# Patient Record
Sex: Female | Born: 1975 | Race: White | Hispanic: No | Marital: Married | Smoking: Never smoker
Health system: Southern US, Community
[De-identification: ages and names within clinical notes are randomized; demographics above are authoritative.]

## PROBLEM LIST (undated history)

## (undated) DIAGNOSIS — K219 Gastro-esophageal reflux disease without esophagitis: Secondary | ICD-10-CM

## (undated) DIAGNOSIS — I10 Essential (primary) hypertension: Secondary | ICD-10-CM

## (undated) HISTORY — DX: Gastro-esophageal reflux disease without esophagitis: K21.9

## (undated) HISTORY — PX: ABDOMINAL HYSTERECTOMY: SHX81

## (undated) HISTORY — PX: WISDOM TOOTH EXTRACTION: SHX21

## (undated) HISTORY — PX: MANDIBLE SURGERY: SHX707

## (undated) HISTORY — DX: Essential (primary) hypertension: I10

## (undated) HISTORY — PX: BREAST ENHANCEMENT SURGERY: SHX7

## (undated) HISTORY — PX: AUGMENTATION MAMMAPLASTY: SUR837

---

## 2015-06-24 ENCOUNTER — Ambulatory Visit
Admission: RE | Admit: 2015-06-24 | Discharge: 2015-06-24 | Disposition: A | Discharge status: Home / Self Care | Payer: BLUE CROSS/BLUE SHIELD | Source: Ambulatory Visit | Attending: Family Medicine | Admitting: Family Medicine

## 2015-06-24 ENCOUNTER — Other Ambulatory Visit: Payer: Self-pay | Admitting: Family Medicine

## 2015-06-24 DIAGNOSIS — M545 Low back pain: Secondary | ICD-10-CM

## 2016-10-18 ENCOUNTER — Ambulatory Visit (INDEPENDENT_AMBULATORY_CARE_PROVIDER_SITE_OTHER): Payer: Self-pay | Admitting: Nurse Practitioner

## 2016-10-18 VITALS — BP 130/90 | HR 88 | Temp 98.2°F | Wt 145.0 lb

## 2016-10-18 DIAGNOSIS — W540XXA Bitten by dog, initial encounter: Secondary | ICD-10-CM

## 2016-10-18 DIAGNOSIS — L03119 Cellulitis of unspecified part of limb: Secondary | ICD-10-CM

## 2016-10-18 MED ORDER — CLINDAMYCIN HCL 300 MG PO CAPS: 300.0000 mg | ORAL_CAPSULE | Freq: Two times a day (BID) | ORAL | 0 refills | Status: AC

## 2016-10-18 MED ORDER — MUPIROCIN CALCIUM 2 % EX CREA: 1.0000 "application " | TOPICAL_CREAM | Freq: Two times a day (BID) | CUTANEOUS | 0 refills | Status: AC

## 2016-10-18 NOTE — Patient Instructions (Addendum)
Cellulitis, Adult Introduction Cellulitis is a skin infection. The infected area is usually red and sore. This condition occurs most often in the arms and lower legs. It is very important to get treated for this condition. Follow these instructions at home:  Take over-the-counter and prescription medicines only as told by your doctor.  Use Ibuprofen 800mg  every 8 hours for pain and inflammation.  If you were prescribed an antibiotic medicine, take it as told by your doctor. Do not stop taking the antibiotic even if you start to feel better.  Drink enough fluid to keep your pee (urine) clear or pale yellow.  Do not touch or rub the infected area.  Raise (elevate) the infected area above the level of your heart while you are sitting or lying down.  Place warm or cold wet cloths (warm or cold compresses) on the infected area. Do this as told by your doctor.  Keep all follow-up visits as told by your doctor. This is important. These visits let your doctor make sure your infection is not getting worse. Contact a doctor if:  You have a fever.  Your symptoms do not get better after 1-2 days of treatment.  Your bone or joint under the infected area starts to hurt after the skin has healed.  Your infection comes back. This can happen in the same area or another area.  You have a swollen bump in the infected area.  You have new symptoms.  You feel ill and also have muscle aches and pains. Get help right away if:  Your symptoms get worse.  You feel very sleepy.  You throw up (vomit) or have watery poop (diarrhea) for a long time.  There are red streaks coming from the infected area.  Your red area gets larger.  Your red area turns darker. This information is not intended to replace advice given to you by your health care provider. Make sure you discuss any questions you have with your health care provider. Document Released: 04/02/2008 Document Revised: 03/22/2016 Document  Reviewed: 08/24/2015  2017 Elsevier  Animal Bite Animal bites can range from mild to serious. An animal bite can result in a scratch on the skin, a deep open cut, a puncture of the skin, a crush injury, or tearing away of the skin or a body part. A small bite from a house pet will usually not cause serious problems. However, some animal bites can become infected or injure a bone or other tissue. Bites from certain animals can be more dangerous because of the risk of spreading rabies, which is a serious viral infection. This risk is higher with bites from stray animals or wild animals, such as raccoons, foxes, skunks, and bats. Dogs are responsible for most animal bites. Children are bitten more often than adults. What are the signs or symptoms? Common symptoms of an animal bite include:  Pain.  Bleeding.  Swelling.  Bruising. How is this diagnosed? This condition may be diagnosed based on a physical exam and medical history. Your health care provider will examine the wound and ask for details about the animal and how the bite happened. You may also have tests, such as:  Blood tests to check for infection or to determine if surgery is needed.  X-rays to check for damage to bones or joints.  Culture test. This uses a sample of fluid from the wound to check for infection. How is this treated? Treatment varies depending on the location and type of animal bite and  your medical history. Treatment may include:  Wound care. This often includes cleaning the wound, flushing the wound with saline solution, and applying a bandage (dressing). Sometimes, the wound is left open to heal because of the high risk of infection. However, in some cases, the wound may be closed with stitches (sutures), staples, skin glue, or adhesive strips.  Antibiotic medicine.  Tetanus shot.  Rabies treatment if the animal could have rabies. In some cases, bites that have become infected may require IV antibiotics  and surgical treatment in the hospital. Follow these instructions at home: Wound care  Follow instructions from your health care provider about how to take care of your wound. Make sure you:  Wash your hands with soap and water before you change your dressing. If soap and water are not available, use hand sanitizer.  Change your dressing as told by your health care provider.  Leave sutures, skin glue, or adhesive strips in place. These skin closures may need to be in place for 2 weeks or longer. If adhesive strip edges start to loosen and curl up, you may trim the loose edges. Do not remove adhesive strips completely unless your health care provider tells you to do that.  Check your wound every day for signs of infection. Watch for:  Increasing redness, swelling, or pain.  Fluid, blood, or pus. General instructions  Take or apply over-the-counter and prescription medicines only as told by your health care provider.  If you were prescribed an antibiotic, take or apply it as told by your health care provider. Do not stop using the antibiotic even if your condition improves.  Keep the injured area raised (elevated) above the level of your heart while you are sitting or lying down, if this is possible.  If directed, apply ice to the injured area.  Put ice in a plastic bag.  Place a towel between your skin and the bag.  Leave the ice on for 20 minutes, 2-3 times per day.  Keep all follow-up visits as told by your health care provider. This is important. Contact a health care provider if:  You have increasing redness, swelling, or pain at the site of your wound.  You have a general feeling of sickness (malaise).  You feel nauseous or you vomit.  You have pain that does not get better. Get help right away if:  You have a red streak extending away from your wound.  You have fluid, blood, or pus coming from your wound.  You have a fever or chills.  You have trouble moving  your injured area.  You have numbness or tingling extending beyond the wound. This information is not intended to replace advice given to you by your health care provider. Make sure you discuss any questions you have with your health care provider. Document Released: 07/03/2011 Document Revised: 02/22/2016 Document Reviewed: 03/02/2015 Elsevier Interactive Patient Education  2017 [employer].

## 2016-10-18 NOTE — Progress Notes (Addendum)
   Subjective:    Patient ID: Hannah Roberts, female    DOB: 06-09-1976, 40 y.o.   MRN: [id number]  The patient is a 40 y.o. Female that presents with a dog bite that occurred around 6pm the night prior.  The patient states she was walking her dog, while walking her dog there was a stray dog that lunged at her dog and she put her hand down to protect the dog.  The patient has no information regarding the dog or his vaccination records.  The patient's last tetanus shot was in 2009 and was administered the rabies immune globulin in 2006.  Today the patient has redness, swelling and pain in the RUE.  There is a puncture wound in the palm of the hand located at the base of the thumb.  There is also a small scratch on the outside of the thumb.  The patient denies fever, chills or sweats.  Patient c/o pain that rates 7/10, swelling, difficulty moving fingers and warmth to the RUE.   Patient also states she is having trouble lifting.  She works in a Psychiatric nurse work.      Review of Systems  Constitutional: Negative for activity change, fatigue and fever.  Respiratory: Negative.   Cardiovascular: Negative.   Skin: Wound: palm of right hand located at the base of the thumb.       Objective:   Physical Exam  Constitutional: She appears well-developed and well-nourished. She appears distressed (mild discomfort noted d/t RUE pain).  HENT:  Head: Normocephalic.  Eyes: Conjunctivae and EOM are normal. Pupils are equal, round, and reactive to light.  Neck: Normal range of motion.  Cardiovascular: Normal rate and regular rhythm.   Pulmonary/Chest: Effort normal and breath sounds normal.  Musculoskeletal: Normal range of motion.  Skin: Skin is warm and dry. There is erythema (erythematous, swollen RUE extending from hand to forearm.  puncture wound measuring approximately  0.5cm in diameter, no drainage, RUE warm to palpation., base of R thumb tender with palpation.).  Cap refill  >3 seconds, +2 radial pulse on RUE  Psychiatric: She has a normal mood and affect. Her behavior is normal. Judgment and thought content normal.    Patient's RUE (hand and forearm) cleansed with Chlorhexidine scrub.  Antibiotic ointment applied to puncture sites.  Dressing applied.  Patient tolerated well.     Assessment & Plan:  Dog Bite and Cellulitis.  Patient prescribed Clindamycin and instructed to use mupirocin ointment.  Patient will return in 24 hours for wound evaluation.  Patient verbalized understanding.

## 2016-10-19 ENCOUNTER — Encounter: Payer: Self-pay | Admitting: Nurse Practitioner

## 2016-10-19 MED ORDER — CLINDAMYCIN HCL 300 MG PO CAPS: 300.0000 mg | ORAL_CAPSULE | Freq: Four times a day (QID) | ORAL | 0 refills | Status: AC

## 2016-10-19 NOTE — Progress Notes (Signed)
Patient in for follow-up wound evaluation.  Patient with decreased swelling and warmth to RUE.  Patient states, "I am able to move my fingers a little easier".  Puncture site on palm at base of thumb with no signs of infection, no drainage, decreased redness and no warmth.  Patient continues to deny fever.  Patient instructed that additional abx will be added.  Patient instructed to call or return if concerns or no improvement.

## 2016-10-21 ENCOUNTER — Telehealth: Payer: Self-pay | Admitting: Nurse Practitioner

## 2016-10-21 NOTE — Telephone Encounter (Signed)
Called patient to follow up on her status.  The patient states she is doing better.  Patient states there is decreased swelling and redness.  Patient states her hand is still "a little sore", but she is doing much better.  Informed patient to continue her antibiotics and Ibuprofen.  Patient will call if concerns.

## 2016-12-11 ENCOUNTER — Ambulatory Visit (INDEPENDENT_AMBULATORY_CARE_PROVIDER_SITE_OTHER): Payer: Self-pay | Admitting: Nurse Practitioner

## 2016-12-11 ENCOUNTER — Encounter: Payer: Self-pay | Admitting: Nurse Practitioner

## 2016-12-11 VITALS — BP 124/86 | HR 86 | Temp 98.3°F | Wt 140.8 lb

## 2016-12-11 DIAGNOSIS — H00011 Hordeolum externum right upper eyelid: Secondary | ICD-10-CM

## 2016-12-11 MED ORDER — ERYTHROMYCIN 5 MG/GM OP OINT: 1.0000 "application " | TOPICAL_OINTMENT | Freq: Four times a day (QID) | OPHTHALMIC | 0 refills | Status: AC

## 2016-12-11 NOTE — Progress Notes (Signed)
Subjective:    Hannah Roberts is a 41 y.o. female who presents for evaluation of right eye soreness in the right eye. She has noticed the above symptoms for 2 weeks. Onset was sudden. Patient denies erythema, foreign body sensation, itching, pain and tearing.  The patient has been using warm compresses to the eye since the onset.  She has also plucked eyelashes from the same eye to r/o infection. There is a history of wearing glasses.  The following portions of the patient's history were reviewed and updated as appropriate: allergies, current medications and past medical history.  Review of Systems Constitutional: positive for fatigue, negative for fevers and night sweats Eyes: positive for irritation, redness and tearing, pain and itching Ears, nose, mouth, throat, and face: positive for earaches, sore throat and rhinorrhea Respiratory: negative Cardiovascular: negative Neurological: positive for headaches   Objective:    BP 124/86 (BP Location: Right Arm, Patient Position: Sitting, Cuff Size: Normal)   Pulse 86   Temp 98.3 F (36.8 C) (Oral)   Wt 140 lb 12.8 oz (63.9 kg)   SpO2 99%       General: alert and cooperative  Eyes:  conjunctivae/corneas clear. PERRL, EOM's intact. Fundi benign., swelling to right upper lid.  resolving ruptured blood vessels to pupil in the 6 o'clock position. erythema and swelling to right upper lid.  mobile hordeolum palpated.  Vision: Not performed  Fluorescein:  not done     Assessment:    Hordeolum   Plan:  Warm compresses 3-4 times/daily. Antibiotic ointment as ordered. Ibuprofen for pain as directed. No makeup until resolved. Cleanse affected eye with warm water. Go to ER if vision changes, increases pain or symptoms do not resolve.

## 2016-12-11 NOTE — Patient Instructions (Addendum)

## 2016-12-13 ENCOUNTER — Telehealth: Payer: Self-pay | Admitting: Nurse Practitioner

## 2016-12-13 NOTE — Telephone Encounter (Signed)
Called patient to follow up.  Spoke with patient and she states her eye is much better.  Patient states she still has some swelling and redness, but there is no pain or itching.  Informed patient to continue use of abx, warm compresses and Ibuprofen.  Patient will call of follow up if no improvement in the next 3-5 days.

## 2017-06-03 ENCOUNTER — Other Ambulatory Visit: Payer: Self-pay | Admitting: Family Medicine

## 2017-06-03 DIAGNOSIS — Z1231 Encounter for screening mammogram for malignant neoplasm of breast: Secondary | ICD-10-CM

## 2017-06-17 ENCOUNTER — Ambulatory Visit
Admission: RE | Admit: 2017-06-17 | Discharge: 2017-06-17 | Disposition: A | Discharge status: Home / Self Care | Payer: 59 | Source: Ambulatory Visit | Attending: Family Medicine | Admitting: Family Medicine

## 2017-06-17 DIAGNOSIS — Z1231 Encounter for screening mammogram for malignant neoplasm of breast: Secondary | ICD-10-CM

## 2018-07-25 ENCOUNTER — Other Ambulatory Visit: Payer: Self-pay | Admitting: Family Medicine

## 2018-07-25 DIAGNOSIS — Z1231 Encounter for screening mammogram for malignant neoplasm of breast: Secondary | ICD-10-CM

## 2018-08-22 ENCOUNTER — Ambulatory Visit
Admission: RE | Admit: 2018-08-22 | Discharge: 2018-08-22 | Disposition: A | Discharge status: Home / Self Care | Payer: 59 | Source: Ambulatory Visit | Attending: Family Medicine | Admitting: Family Medicine

## 2018-08-22 DIAGNOSIS — Z1231 Encounter for screening mammogram for malignant neoplasm of breast: Secondary | ICD-10-CM

## 2018-11-11 DIAGNOSIS — I10 Essential (primary) hypertension: Secondary | ICD-10-CM | POA: Diagnosis not present

## 2018-11-11 DIAGNOSIS — J111 Influenza due to unidentified influenza virus with other respiratory manifestations: Secondary | ICD-10-CM | POA: Diagnosis not present

## 2019-03-06 DIAGNOSIS — M9902 Segmental and somatic dysfunction of thoracic region: Secondary | ICD-10-CM | POA: Diagnosis not present

## 2019-03-06 DIAGNOSIS — M9903 Segmental and somatic dysfunction of lumbar region: Secondary | ICD-10-CM | POA: Diagnosis not present

## 2019-03-06 DIAGNOSIS — M9901 Segmental and somatic dysfunction of cervical region: Secondary | ICD-10-CM | POA: Diagnosis not present

## 2019-03-10 DIAGNOSIS — M9903 Segmental and somatic dysfunction of lumbar region: Secondary | ICD-10-CM | POA: Diagnosis not present

## 2019-03-10 DIAGNOSIS — M9902 Segmental and somatic dysfunction of thoracic region: Secondary | ICD-10-CM | POA: Diagnosis not present

## 2019-03-10 DIAGNOSIS — M9901 Segmental and somatic dysfunction of cervical region: Secondary | ICD-10-CM | POA: Diagnosis not present

## 2019-03-17 DIAGNOSIS — I10 Essential (primary) hypertension: Secondary | ICD-10-CM | POA: Diagnosis not present

## 2019-03-17 DIAGNOSIS — G479 Sleep disorder, unspecified: Secondary | ICD-10-CM | POA: Diagnosis not present

## 2019-03-20 DIAGNOSIS — M9901 Segmental and somatic dysfunction of cervical region: Secondary | ICD-10-CM | POA: Diagnosis not present

## 2019-03-20 DIAGNOSIS — M9903 Segmental and somatic dysfunction of lumbar region: Secondary | ICD-10-CM | POA: Diagnosis not present

## 2019-03-20 DIAGNOSIS — M9902 Segmental and somatic dysfunction of thoracic region: Secondary | ICD-10-CM | POA: Diagnosis not present

## 2019-07-31 ENCOUNTER — Other Ambulatory Visit: Payer: Self-pay | Admitting: Family Medicine

## 2019-07-31 DIAGNOSIS — Z1231 Encounter for screening mammogram for malignant neoplasm of breast: Secondary | ICD-10-CM

## 2019-08-28 ENCOUNTER — Ambulatory Visit
Admission: RE | Admit: 2019-08-28 | Discharge: 2019-08-28 | Disposition: A | Discharge status: Home / Self Care | Payer: 59 | Source: Ambulatory Visit | Attending: Family Medicine | Admitting: Family Medicine

## 2019-08-28 ENCOUNTER — Other Ambulatory Visit: Payer: Self-pay

## 2019-08-28 DIAGNOSIS — Z1231 Encounter for screening mammogram for malignant neoplasm of breast: Secondary | ICD-10-CM

## 2020-08-29 ENCOUNTER — Other Ambulatory Visit: Payer: Self-pay | Admitting: Family Medicine

## 2020-08-29 DIAGNOSIS — Z1231 Encounter for screening mammogram for malignant neoplasm of breast: Secondary | ICD-10-CM

## 2020-09-09 ENCOUNTER — Ambulatory Visit
Admission: RE | Admit: 2020-09-09 | Discharge: 2020-09-09 | Disposition: A | Discharge status: Home / Self Care | Payer: 59 | Source: Ambulatory Visit | Attending: Family Medicine | Admitting: Family Medicine

## 2020-09-09 ENCOUNTER — Other Ambulatory Visit: Payer: Self-pay

## 2020-09-09 DIAGNOSIS — Z1231 Encounter for screening mammogram for malignant neoplasm of breast: Secondary | ICD-10-CM

## 2021-12-15 IMAGING — MG DIGITAL SCREENING BREAST BILAT IMPLANT W/ TOMO W/ CAD
9 of 17 series · 9 of 40 positions shown · non-contrast
Comparison: Previous exam(s).

CLINICAL DATA: Screening.

EXAM:
DIGITAL SCREENING BILATERAL MAMMOGRAM WITH IMPLANTS, CAD AND TOMO
The patient has prepectoral implants. Standard and implant displaced
views were performed.

[R CC]
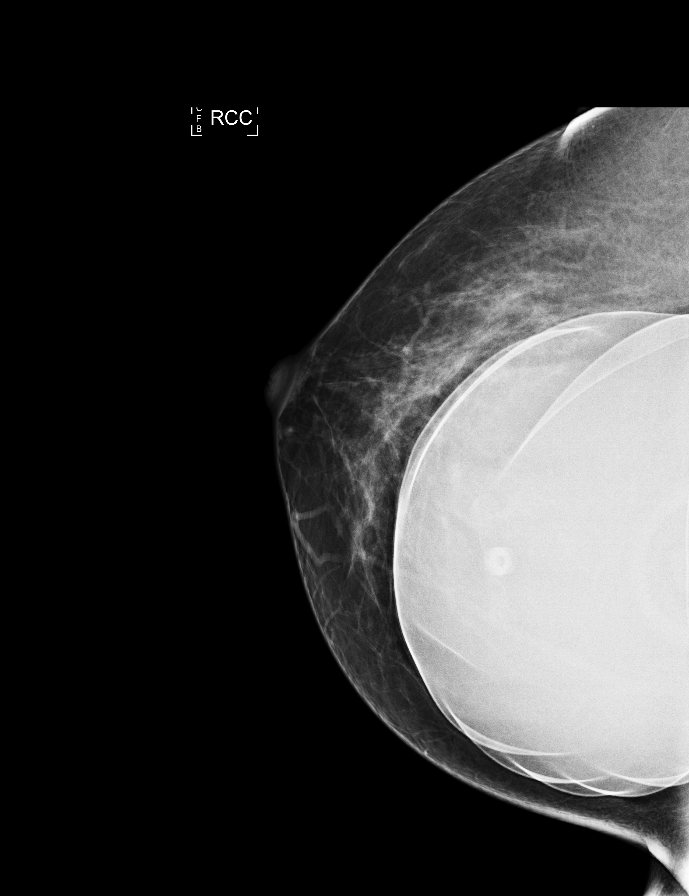

[R MLO]
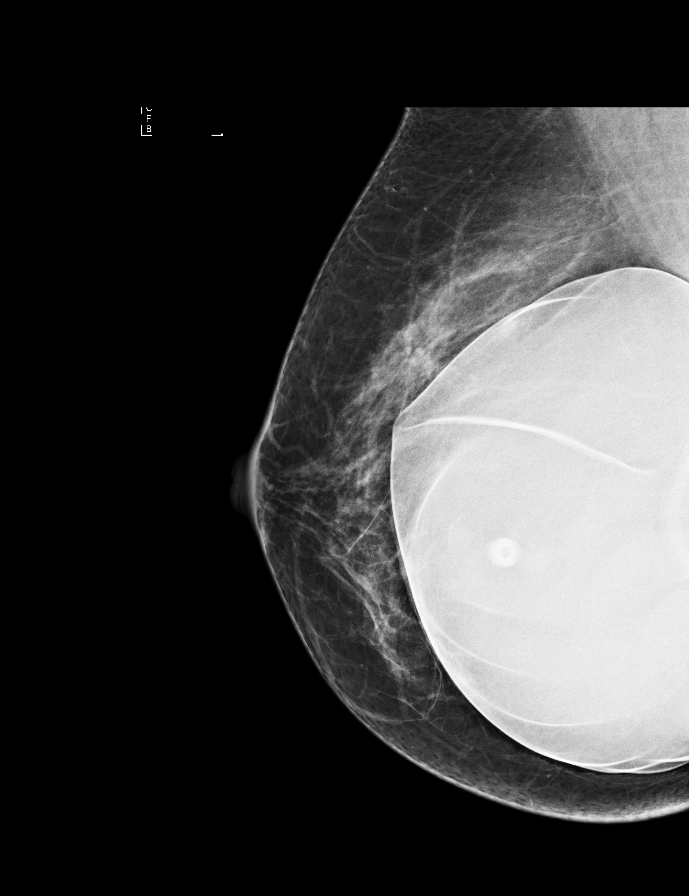

[L CC]
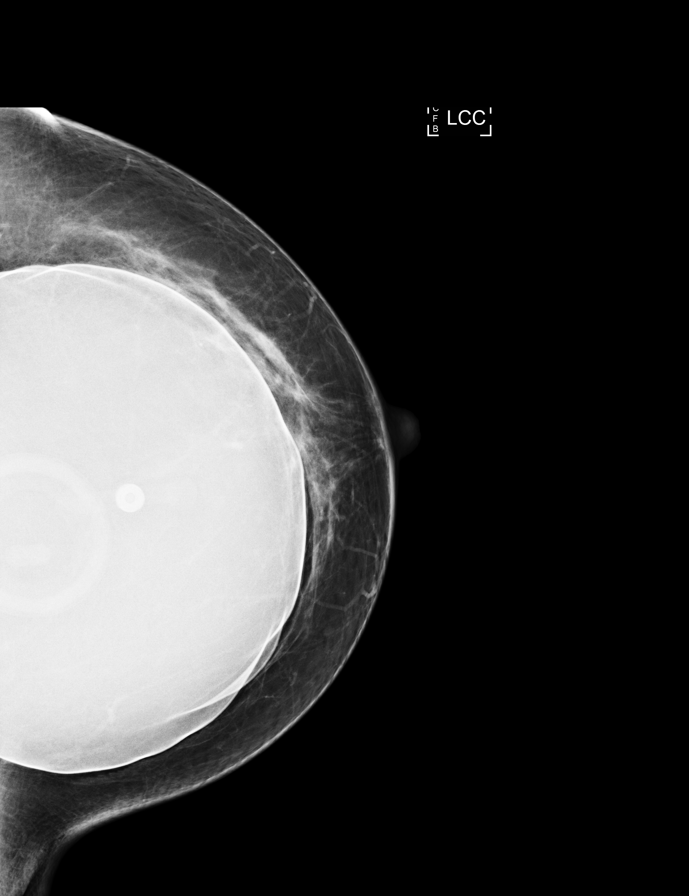

[L MLO]
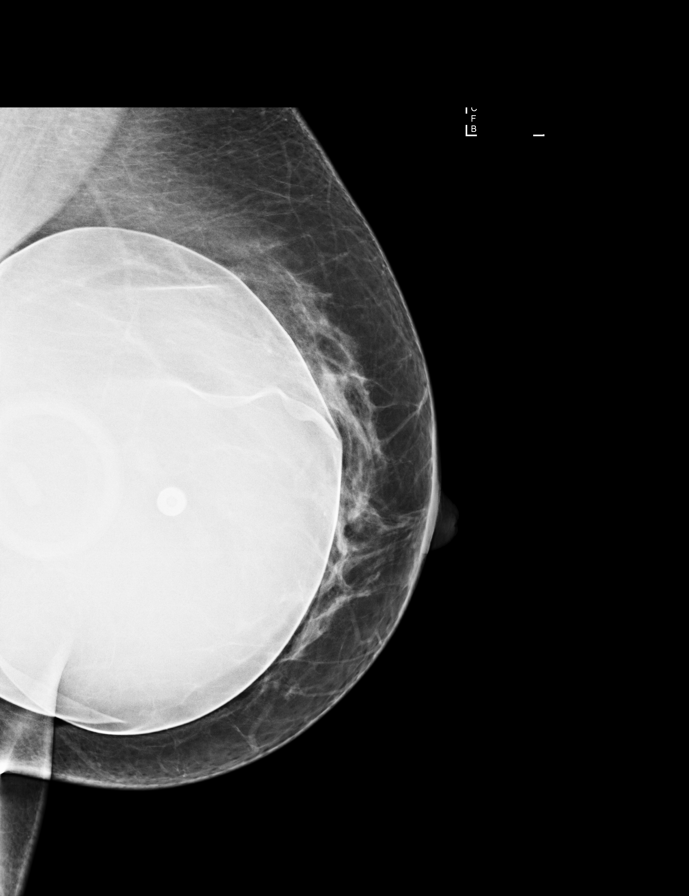

[L CC synth-2D (1 of 2)]
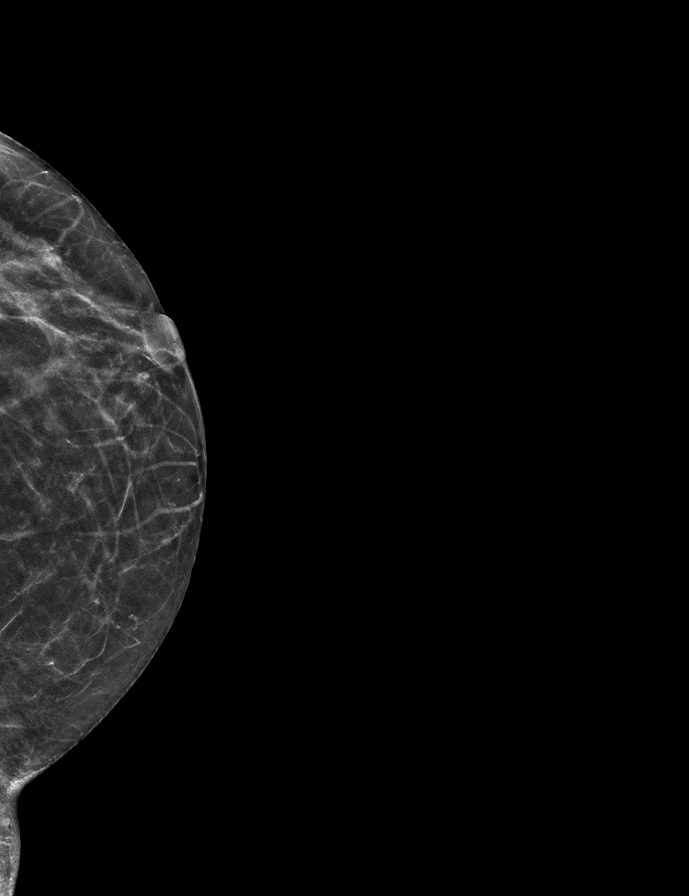

[L CC synth-2D (2 of 2)]
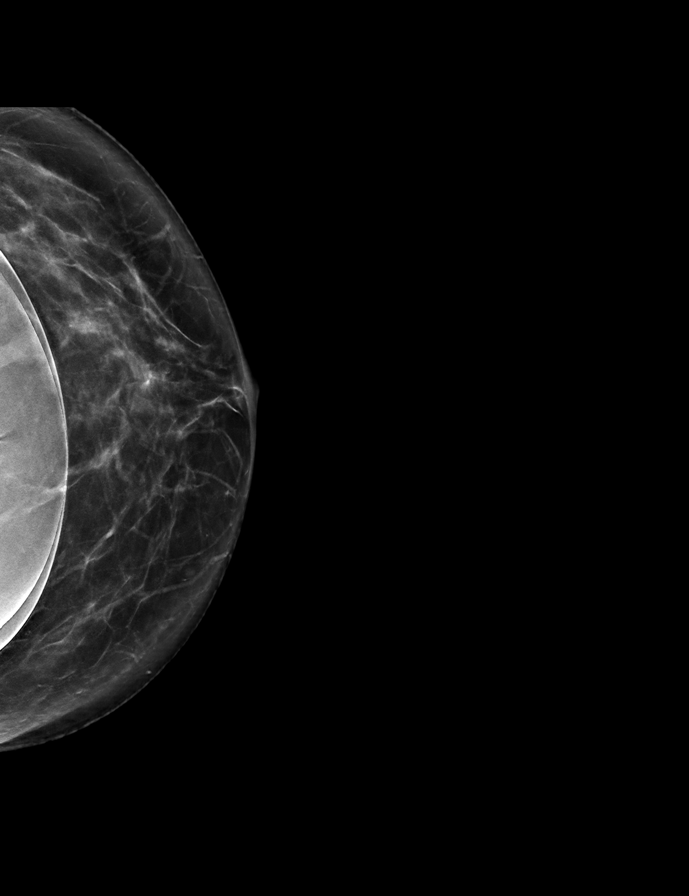

[R CC synth-2D]
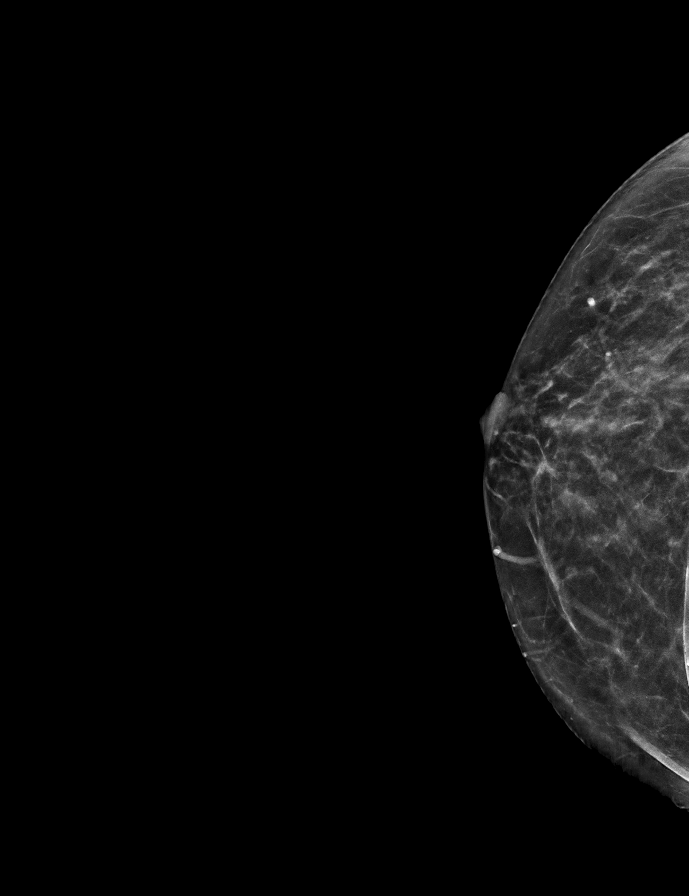

[L MLO synth-2D]
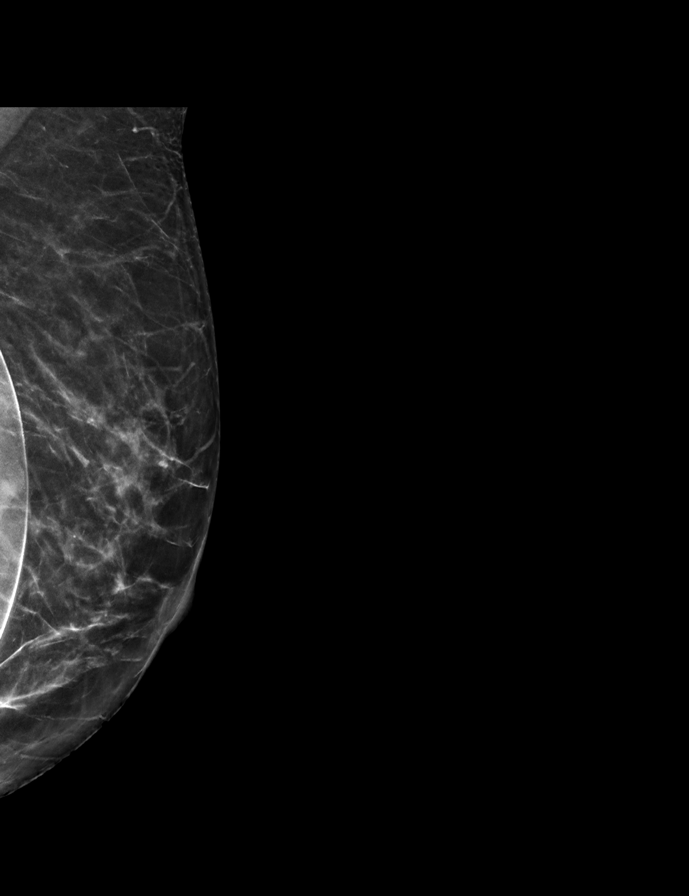

[R MLO synth-2D]
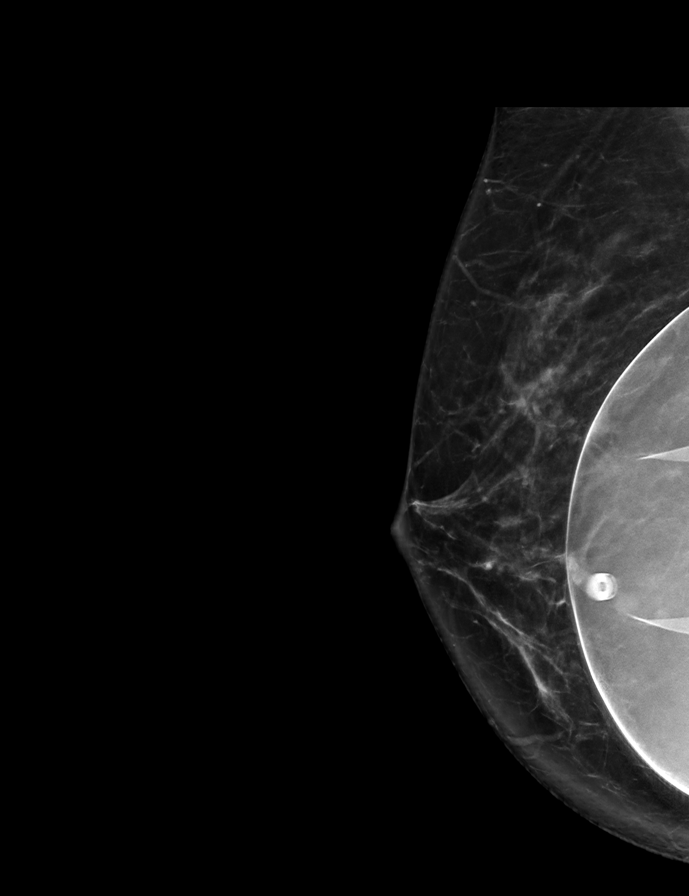

[9 of 40 positions shown; findings below may reference images not displayed]

ACR Breast Density Category c: The breast tissue is heterogeneously
dense, which may obscure small masses.
FINDINGS: There are no findings suspicious for malignancy. Images were
processed with CAD.
IMPRESSION: No mammographic evidence of malignancy. A result letter of this
screening mammogram will be mailed directly to the patient.

RECOMMENDATION:
Screening mammogram in one year. (Code:TS-8-EXC)

BI-RADS CATEGORY  1:  Negative.

## 2022-03-07 ENCOUNTER — Emergency Department (HOSPITAL_BASED_OUTPATIENT_CLINIC_OR_DEPARTMENT_OTHER)
Admission: EM | Admit: 2022-03-07 | Discharge: 2022-03-07 | Disposition: A | Discharge status: Home / Self Care | Payer: 59 | Attending: Emergency Medicine | Admitting: Emergency Medicine

## 2022-03-07 ENCOUNTER — Encounter (HOSPITAL_BASED_OUTPATIENT_CLINIC_OR_DEPARTMENT_OTHER): Payer: Self-pay

## 2022-03-07 ENCOUNTER — Emergency Department (HOSPITAL_BASED_OUTPATIENT_CLINIC_OR_DEPARTMENT_OTHER): Payer: 59 | Admitting: Radiology

## 2022-03-07 ENCOUNTER — Other Ambulatory Visit: Payer: Self-pay

## 2022-03-07 DIAGNOSIS — I10 Essential (primary) hypertension: Secondary | ICD-10-CM | POA: Insufficient documentation

## 2022-03-07 DIAGNOSIS — D582 Other hemoglobinopathies: Secondary | ICD-10-CM | POA: Insufficient documentation

## 2022-03-07 DIAGNOSIS — R0602 Shortness of breath: Secondary | ICD-10-CM | POA: Insufficient documentation

## 2022-03-07 DIAGNOSIS — R0789 Other chest pain: Secondary | ICD-10-CM | POA: Insufficient documentation

## 2022-03-07 DIAGNOSIS — R519 Headache, unspecified: Secondary | ICD-10-CM | POA: Insufficient documentation

## 2022-03-07 DIAGNOSIS — Z79899 Other long term (current) drug therapy: Secondary | ICD-10-CM | POA: Insufficient documentation

## 2022-03-07 DIAGNOSIS — R079 Chest pain, unspecified: Secondary | ICD-10-CM

## 2022-03-07 DIAGNOSIS — E860 Dehydration: Secondary | ICD-10-CM | POA: Diagnosis not present

## 2022-03-07 LAB — CBC
HCT: 48.3 % — ABNORMAL HIGH (ref 36.0–46.0)
Hemoglobin: 16.3 g/dL — ABNORMAL HIGH (ref 12.0–15.0)
MCH: 31.1 pg (ref 26.0–34.0)
MCHC: 33.7 g/dL (ref 30.0–36.0)
MCV: 92.2 fL (ref 80.0–100.0)
Platelets: 299 10*3/uL (ref 150–400)
RBC: 5.24 MIL/uL — ABNORMAL HIGH (ref 3.87–5.11)
RDW: 12.5 % (ref 11.5–15.5)
WBC: 6.6 10*3/uL (ref 4.0–10.5)
nRBC: 0 % (ref 0.0–0.2)

## 2022-03-07 LAB — BASIC METABOLIC PANEL
Anion gap: 10 (ref 5–15)
BUN: 12 mg/dL (ref 6–20)
CO2: 29 mmol/L (ref 22–32)
Calcium: 9.8 mg/dL (ref 8.9–10.3)
Chloride: 96 mmol/L — ABNORMAL LOW (ref 98–111)
Creatinine, Ser: 0.82 mg/dL (ref 0.44–1.00)
GFR, Estimated: >60 mL/min (ref >60)
Glucose, Bld: 89 mg/dL (ref 70–99)
Potassium: 3.6 mmol/L (ref 3.5–5.1)
Sodium: 135 mmol/L (ref 135–145)

## 2022-03-07 LAB — TROPONIN I (HIGH SENSITIVITY)
Troponin I (High Sensitivity): <2 ng/L (ref <18)
Troponin I (High Sensitivity): <2 ng/L (ref <18)

## 2022-03-07 MED ORDER — SODIUM CHLORIDE 0.9 % IV BOLUS
1000.0000 mL | Freq: Once | INTRAVENOUS | Status: AC
Start: 2022-03-07 — End: 2022-03-07
  Administered 2022-03-07: 1000 mL via INTRAVENOUS

## 2022-03-07 MED ORDER — KETOROLAC TROMETHAMINE 15 MG/ML IJ SOLN
15.0000 mg | Freq: Once | INTRAMUSCULAR | Status: AC
Start: 2022-03-07 — End: 2022-03-07
  Administered 2022-03-07: 15 mg via INTRAVENOUS
  Filled 2022-03-07: qty 1

## 2022-03-07 MED ORDER — DIPHENHYDRAMINE HCL 50 MG/ML IJ SOLN
25.0000 mg | Freq: Once | INTRAMUSCULAR | Status: AC
  Administered 2022-03-07: 25 mg via INTRAVENOUS
  Filled 2022-03-07: qty 1

## 2022-03-07 MED ORDER — PROCHLORPERAZINE EDISYLATE 10 MG/2ML IJ SOLN
10.0000 mg | Freq: Once | INTRAMUSCULAR | Status: AC
  Administered 2022-03-07: 10 mg via INTRAVENOUS
  Filled 2022-03-07: qty 2

## 2022-03-07 MED ORDER — ACETAMINOPHEN 325 MG PO TABS
650.0000 mg | ORAL_TABLET | Freq: Once | ORAL | Status: AC
  Administered 2022-03-07: 650 mg via ORAL
  Filled 2022-03-07: qty 2

## 2022-03-07 NOTE — Discharge Instructions (Signed)
Please use Tylenol or ibuprofen for pain.  You may use 600 mg ibuprofen every 6 hours or 1000 mg of Tylenol every 6 hours.  You may choose to alternate between the 2.  This would be most effective.  Not to exceed 4 g of Tylenol within 24 hours.  Not to exceed 3200 mg ibuprofen 24 hours.  

## 2022-03-07 NOTE — ED Provider Notes (Signed)
?[facility] ?Provider Note ? ? ?CSN: [id number] ?Arrival date & time: 03/07/22  1155 ? ?  ? ?History ? ?Chief Complaint  ?Patient presents with  ? Chest Pain  ? ? ?Hannah Roberts is a 46 y.o. female with past medical history significant for hypertension, acid reflux who presents with concern for chest pain which began 2 hours prior to arrival, patient describes as central in the sternum, like a tightness, with some dull pain.  She denies any exertional component of the chest pain.  She reports it is coming on and off does not seem to be associated with walking or motion.  She reports feeling somewhat short of breath as well.  Patient reports that she has had a headache as well on and off since Friday, as well as labile blood pressures with systolic the highest in the 016W, diastolic up to 109.  Patient does take hydralazine for her blood pressure but does not had any issues in the past.  She denies history of hyperlipidemia, diabetes, previous ACS, stroke, previous Cardiologic evaluation, first-degree family history of acute ACS or CAD at a young age.  She denies nausea, vomiting associated with her headache, she denies any blurry vision, photophobia with her headache.  She does endorse feeling slightly unsteady on her feet earlier today, as well as feeling somewhat hot and sweaty with the chest pain.  She denies any increase in life stress, or feeling similar to acid reflux at this time. ? ?HPI ? ?  ? ?Home Medications ?Prior to Admission medications   ?Medication Sig Start Date End Date Taking? Authorizing Provider  ?buPROPion (WELLBUTRIN XL) 150 MG 24 hr tablet Take 450 mg by mouth daily. 12/27/21   [provider]  ?citalopram (CELEXA) 40 MG tablet Take 40 mg by mouth daily. 07/19/16   [provider]  ?DEXILANT 60 MG capsule Take 1 capsule by mouth daily. 09/29/16   [provider]  ?hydrochlorothiazide (HYDRODIURIL) 25 MG tablet Take 25 mg by mouth daily.  09/03/16   [provider]  ?zolpidem (AMBIEN CR) 12.5 MG CR tablet Take 12.5 mg by mouth at bedtime as needed. 02/15/22   [provider]  ?   ? ?Allergies    ?Keflex [cephalexin] and Penicillins   ? ?Review of Systems   ?Review of Systems  ?Respiratory:  Positive for chest tightness and shortness of breath.   ?Cardiovascular:  Positive for chest pain.  ?Neurological:  Positive for headaches.  ?All other systems reviewed and are negative. ? ?Physical Exam ?Updated Vital Signs ?BP 115/77   Pulse 73   Temp 98.7 ?F (37.1 ?C) (Oral)   Resp 14   SpO2 99%  ?Physical Exam ?Vitals and nursing note reviewed.  ?Constitutional:   ?   General: She is not in acute distress. ?   Appearance: Normal appearance. She is not ill-appearing.  ?HENT:  ?   Head: Normocephalic and atraumatic.  ?Eyes:  ?   General:     ?   Right eye: No discharge.     ?   Left eye: No discharge.  ?Cardiovascular:  ?   Rate and Rhythm: Normal rate and regular rhythm.  ?   Heart sounds: No murmur heard. ?  No friction rub. No gallop.  ?   Comments: Patient does have some reproducibility of the chest pain with palpation of the chest wall.  No evidence of bruising or other abnormality of the chest wall. ?Pulmonary:  ?   Effort: Pulmonary  effort is normal.  ?   Breath sounds: Normal breath sounds.  ?Abdominal:  ?   General: Bowel sounds are normal.  ?   Palpations: Abdomen is soft.  ?Skin: ?   General: Skin is warm and dry.  ?   Capillary Refill: Capillary refill takes less than 2 seconds.  ?Neurological:  ?   Mental Status: She is alert and oriented to person, place, and time.  ?   Comments: Cranial nerves II through XII grossly intact.  Intact finger-nose, intact heel-to-shin.  Romberg negative, gait normal.  Alert and oriented x3.  Moves all 4 limbs spontaneously, normal coordination.  No pronator drift.  Intact strength 5 out of 5 bilateral upper and lower extremities. ? ?  ?Psychiatric:     ?   Mood and Affect: Mood normal.     ?    Behavior: Behavior normal.  ? ? ?ED Results / Procedures / Treatments   ?Labs ?(all labs ordered are listed, but only abnormal results are displayed) ?Labs Reviewed  ?BASIC METABOLIC PANEL - Abnormal; Notable for the following components:  ?    Result Value  ? Chloride 96 (*)   ? All other components within normal limits  ?CBC - Abnormal; Notable for the following components:  ? RBC 5.24 (*)   ? Hemoglobin 16.3 (*)   ? HCT 48.3 (*)   ? All other components within normal limits  ?TROPONIN I (HIGH SENSITIVITY)  ?TROPONIN I (HIGH SENSITIVITY)  ? ? ?EKG ?None ? ?Radiology ?DG Chest 2 View ? ?Result Date: 03/07/2022 ?CLINICAL DATA:  Chest pain and hypertension. EXAM: CHEST - 2 VIEW COMPARISON:  None Available. FINDINGS: The cardiac silhouette, mediastinal and hilar contours are normal. The lungs are clear. No pleural effusions. No pulmonary lesions. The bony thorax is intact. IMPRESSION: No acute cardiopulmonary findings. Electronically Signed   By: Marijo Sanes M.D.   On: 03/07/2022 13:16   ? ?Procedures ?Procedures  ? ? ?Medications Ordered in ED ?Medications  ?sodium chloride 0.9 % bolus 1,000 mL (0 mLs Intravenous Stopped 03/07/22 1340)  ?ketorolac (TORADOL) 15 MG/ML injection 15 mg (15 mg Intravenous Given 03/07/22 1234)  ?prochlorperazine (COMPAZINE) injection 10 mg (10 mg Intravenous Given 03/07/22 1234)  ?diphenhydrAMINE (BENADRYL) injection 25 mg (25 mg Intravenous Given 03/07/22 1234)  ?acetaminophen (TYLENOL) tablet 650 mg (650 mg Oral Given 03/07/22 1233)  ? ? ?ED Course/ Medical Decision Making/ A&P ?  ?                        ?Medical Decision Making ?Amount and/or Complexity of Data Reviewed ?Labs: ordered. ?Radiology: ordered. ? ?Risk ?OTC drugs. ?Prescription drug management. ? ? ?This patient presents to the ED for concern of chest pain, shortness of breath, headache, this involves an extensive number of treatment options, and is a complaint that carries with it a high risk of complications and morbidity.  The emergent differential diagnosis prior to evaluation includes, but is not limited to, acute ACS, AAS, pneumonia, pulmonary embolism, asthma or COPD exacerbation, Boerhaave's, otherwise, GERD, anxiety, less clinical suspicion for atypical presentation of vertebral artery or carotid dissection, patient with no neurologic deficits on exam.  Additionally considered other acute or surgical intrathoracic or intra-abdominal abnormalities bleeding endocarditis, pericarditis, myocarditis, cardiac tamponade, versus other.  ? ?This is not an exhaustive differential.  ? ?Past Medical History / Co-morbidities / Social History: ?Hypertension, GERD, anxiety ? ?Additional history: ?Chart reviewed. Pertinent results include: Outpatient primary care, OB/GYN visits  including lab work ? ?Physical Exam: ?Physical exam performed. The pertinent findings include: Patient with some tenderness palpation of the chest wall which she reports is similar to the pain she has been experiencing, she has no neurologic deficits on my exam ? ?Lab Tests: ?I ordered, and personally interpreted labs.  The pertinent results include:  CBC with slight elevation of hemoglobin at 16.3, possible secondary to hemoconcentration, mild dehydration vs. Other, no leukocytosis.  Unremarkable BMP, negative troponin x1, will wait for delta troponin in context of chest pain only present for 2 hours prior to arrival. ?  ?Imaging Studies: ?I ordered imaging studies including plain film radiograph of the chest. I independently visualized and interpreted imaging which showed no evidence of intrathoracic abnormality. I agree with the radiologist interpretation. ?  ?Cardiac Monitoring:  ?The patient was maintained on a cardiac monitor.  My attending physician Dr. Armandina Gemma viewed and interpreted the cardiac monitored which showed an underlying rhythm of: NSR ?  ?Medications: ?I ordered medication including migraine cocktail for headache. Reevaluation of the patient after  these medicines showed that the patient improved.  Patient with significant improvement of headache, but does endorse some ongoing chest tightness, chest pain.  I have reviewed the patients home medicines and have made a

## 2022-03-07 NOTE — ED Triage Notes (Signed)
She reports a known hx of hypertension. She states her b/p "has been unusually elevated since Friday and I've had a headache". She became concerned today, when she experienced upper central chest "pressure like I've never had before". Her husband is with her. ?

## 2022-03-23 ENCOUNTER — Telehealth: Payer: Self-pay | Admitting: Oncology

## 2022-03-23 NOTE — Telephone Encounter (Signed)
Scheduled appt per 5/26 referral. Pt is aware of appt date and time. Pt is aware to arrive 15 mins prior to appt time and to bring and updated insurance card. Pt is aware of appt location.   

## 2022-04-10 ENCOUNTER — Inpatient Hospital Stay: Payer: 59 | Attending: Oncology | Admitting: Oncology

## 2022-04-10 ENCOUNTER — Inpatient Hospital Stay: Payer: 59

## 2022-04-10 ENCOUNTER — Encounter: Payer: Self-pay | Admitting: Oncology

## 2022-04-10 ENCOUNTER — Other Ambulatory Visit: Payer: Self-pay

## 2022-04-10 VITALS — BP 142/104 | HR 93 | Temp 98.8°F | Resp 16 | Wt 159.0 lb

## 2022-04-10 DIAGNOSIS — Z806 Family history of leukemia: Secondary | ICD-10-CM | POA: Diagnosis not present

## 2022-04-10 DIAGNOSIS — I10 Essential (primary) hypertension: Secondary | ICD-10-CM | POA: Insufficient documentation

## 2022-04-10 DIAGNOSIS — D751 Secondary polycythemia: Secondary | ICD-10-CM | POA: Insufficient documentation

## 2022-04-10 DIAGNOSIS — D45 Polycythemia vera: Secondary | ICD-10-CM

## 2022-04-10 DIAGNOSIS — Z79899 Other long term (current) drug therapy: Secondary | ICD-10-CM | POA: Insufficient documentation

## 2022-04-10 LAB — CBC WITH DIFFERENTIAL (CANCER CENTER ONLY)
Abs Immature Granulocytes: 0.01 10*3/uL (ref 0.00–0.07)
Basophils Absolute: 0.1 10*3/uL (ref 0.0–0.1)
Basophils Relative: 1 %
Eosinophils Absolute: 0.1 10*3/uL (ref 0.0–0.5)
Eosinophils Relative: 1 %
HCT: 47.6 % — ABNORMAL HIGH (ref 36.0–46.0)
Hemoglobin: 16.8 g/dL — ABNORMAL HIGH (ref 12.0–15.0)
Immature Granulocytes: 0 %
Lymphocytes Relative: 29 %
Lymphs Abs: 1.5 10*3/uL (ref 0.7–4.0)
MCH: 31.6 pg (ref 26.0–34.0)
MCHC: 35.3 g/dL (ref 30.0–36.0)
MCV: 89.5 fL (ref 80.0–100.0)
Monocytes Absolute: 0.5 10*3/uL (ref 0.1–1.0)
Monocytes Relative: 10 %
Neutro Abs: 3.1 10*3/uL (ref 1.7–7.7)
Neutrophils Relative %: 59 %
Platelet Count: 332 10*3/uL (ref 150–400)
RBC: 5.32 MIL/uL — ABNORMAL HIGH (ref 3.87–5.11)
RDW: 11.9 % (ref 11.5–15.5)
WBC Count: 5.3 10*3/uL (ref 4.0–10.5)
nRBC: 0 % (ref 0.0–0.2)

## 2022-04-10 NOTE — Progress Notes (Signed)
Reason for the request:    Polycythemia  HPI: I was asked by Dr. Leonides Schanz to evaluate Hannah Roberts for the evaluation of elevated hemoglobin.  She is a 46 year old woman with history of hypertension who has been on hydrochlorothiazide and recently has been noticing increase in her blood pressure.  She does report some symptoms of fatigue and tiredness as well.  Laboratory data obtained on Mar 07, 2022 showed a hemoglobin of 16.3 with hematocrit of 48.  Her MCV is 92 and a platelet count of 299.  Her white cell count is 6.6.  Previous counts dating back to 2019 showed a hemoglobin of 15.3.  In 2009 her hemoglobin was about 16 as well.  She denies any smoking history or any pulmonary complaints.  He denies any signs symptoms of sleep apnea or hormone exposure.  He denies any constitutional symptoms of weight loss or appetite changes.-Status quality of life remains unchanged.   She does not report any headaches, blurry vision, syncope or seizures. Does not report any fevers, chills or sweats.  Does not report any cough, wheezing or hemoptysis.  Does not report any chest pain, palpitation, orthopnea or leg edema.  Does not report any nausea, vomiting or abdominal pain.  Does not report any constipation or diarrhea.  Does not report any skeletal complaints.    Does not report frequency, urgency or hematuria.  Does not report any skin rashes or lesions. Does not report any heat or cold intolerance.  Does not report any lymphadenopathy or petechiae.  Does not report any anxiety or depression.  Remaining review of systems is negative.     Past Medical History:  Diagnosis Date   GERD (gastroesophageal reflux disease)    Hypertension   :   Past Surgical History:  Procedure Laterality Date   ABDOMINAL HYSTERECTOMY     AUGMENTATION MAMMAPLASTY Bilateral    BREAST ENHANCEMENT SURGERY     MANDIBLE SURGERY     WISDOM TOOTH EXTRACTION    :   Current Outpatient Medications:    buPROPion (WELLBUTRIN XL) 150 MG  24 hr tablet, Take 450 mg by mouth daily., Disp: , Rfl:    citalopram (CELEXA) 40 MG tablet, Take 40 mg by mouth daily., Disp: , Rfl: 0   DEXILANT 60 MG capsule, Take 1 capsule by mouth daily., Disp: , Rfl: 1   hydrochlorothiazide (HYDRODIURIL) 25 MG tablet, Take 25 mg by mouth daily., Disp: , Rfl: 1   zolpidem (AMBIEN CR) 12.5 MG CR tablet, Take 12.5 mg by mouth at bedtime as needed., Disp: , Rfl: :   Allergies  Allergen Reactions   Keflex [Cephalexin] Other (See Comments)    Sweating and rash   Penicillins   :   Family History  Problem Relation Age of Onset   Cancer Father        Leukemia   Breast cancer Neg Hx   :   Social History   Socioeconomic History   Marital status: Married    Spouse name: Not on file   Number of children: Not on file   Years of education: Not on file   Highest education level: Not on file  Occupational History   Not on file  Tobacco Use   Smoking status: Never   Smokeless tobacco: Never  Substance and Sexual Activity   Alcohol use: Yes   Drug use: No   Sexual activity: Not on file  Other Topics Concern   Not on file  Social History Narrative  Not on file   Social Determinants of Health   Financial Resource Strain: Not on file  Food Insecurity: Not on file  Transportation Needs: Not on file  Physical Activity: Not on file  Stress: Not on file  Social Connections: Not on file  Intimate Partner Violence: Not on file  :  Pertinent items are noted in HPI.  Exam: Blood pressure (!) 142/104, pulse 93, temperature 98.8 F (37.1 C), resp. rate 16, weight 159 lb (72.1 kg), SpO2 97 %. General appearance: alert and cooperative appeared without distress. Head: atraumatic without any abnormalities. Eyes: conjunctivae/corneas clear. PERRL.  Sclera anicteric. Throat: lips, mucosa, and tongue normal; without oral thrush or ulcers. Resp: clear to auscultation bilaterally without rhonchi, wheezes or dullness to percussion. Cardio: regular  rate and rhythm, S1, S2 normal, no murmur, click, rub or gallop GI: soft, non-tender; bowel sounds normal; no masses,  no organomegaly Skin: Skin color, texture, turgor normal. No rashes or lesions Lymph nodes: Cervical, supraclavicular, and axillary nodes normal. Neurologic: Grossly normal without any motor, sensory or deep tendon reflexes. Musculoskeletal: No joint deformity or effusion.    Assessment and Plan:   46 year old woman with:  1.  Polycythemia noted in May 2023 although this appears to be fluctuating dating back to at least 2009.  She has normal white cell count and platelet count and minimally symptomatic at this time.  The differential diagnosis was discussed at this time.  Polycythemia vera could be a consideration given the persistent and fluctuating elevation in her hemoglobin.  Secondary causes including hemoconcentration from a diuretic versus undiagnosed sleep apnea could be a consideration.  From a management standpoint, therapeutic phlebotomy can be instituted given her persistent hemoglobin that is above 45.  Before proceeding with any phlebotomy will repeat laboratory testing today including erythropoietin level as well as a polycythemia vera panel.  2.  Thrombosis prophylaxis: She is at low risk given her young age and normal white cell count.  3.  Follow-up: will be in 6 months for repeat evaluation and sooner if needed 2.  45  minutes were dedicated to this visit. The time was spent on reviewing laboratory data,  discussing treatment options, discussing differential diagnosis and answering questions regarding future plan.    A copy of this consult has been forwarded to the requesting physician.

## 2022-04-12 LAB — ERYTHROPOIETIN: Erythropoietin: 5.6 m[IU]/mL (ref 2.6–18.5)

## 2022-04-17 ENCOUNTER — Telehealth: Payer: Self-pay | Admitting: *Deleted

## 2022-04-17 LAB — JAK2 (INCLUDING V617F AND EXON 12), MPL,& CALR-NEXT GEN SEQ

## 2022-04-17 NOTE — Telephone Encounter (Signed)
-----   Message from Wyatt Portela, MD sent at 04/17/2022 12:31 PM EDT ----- Please let her know her labs don't show a blood disorder.  Although her hemoglobin is mildly high she does not need phlebotomy.  I will update her labs in 6 months like we discussed.

## 2022-04-17 NOTE — Telephone Encounter (Signed)
LM with note below 

## 2022-10-02 ENCOUNTER — Inpatient Hospital Stay: Payer: Commercial Managed Care - HMO | Attending: Oncology

## 2022-10-02 ENCOUNTER — Inpatient Hospital Stay (HOSPITAL_BASED_OUTPATIENT_CLINIC_OR_DEPARTMENT_OTHER): Payer: Commercial Managed Care - HMO | Admitting: Oncology

## 2022-10-02 ENCOUNTER — Other Ambulatory Visit: Payer: Self-pay

## 2022-10-02 VITALS — BP 147/103 | HR 81 | Temp 97.8°F | Resp 16 | Ht 63.5 in | Wt 162.7 lb

## 2022-10-02 DIAGNOSIS — D751 Secondary polycythemia: Secondary | ICD-10-CM | POA: Diagnosis present

## 2022-10-02 DIAGNOSIS — D45 Polycythemia vera: Secondary | ICD-10-CM | POA: Diagnosis not present

## 2022-10-02 LAB — CBC WITH DIFFERENTIAL (CANCER CENTER ONLY)
Abs Immature Granulocytes: 0.01 10*3/uL (ref 0.00–0.07)
Basophils Absolute: 0.1 10*3/uL (ref 0.0–0.1)
Basophils Relative: 1 %
Eosinophils Absolute: 0.1 10*3/uL (ref 0.0–0.5)
Eosinophils Relative: 1 %
HCT: 46.1 % — ABNORMAL HIGH (ref 36.0–46.0)
Hemoglobin: 16 g/dL — ABNORMAL HIGH (ref 12.0–15.0)
Immature Granulocytes: 0 %
Lymphocytes Relative: 26 %
Lymphs Abs: 1.5 10*3/uL (ref 0.7–4.0)
MCH: 32.3 pg (ref 26.0–34.0)
MCHC: 34.7 g/dL (ref 30.0–36.0)
MCV: 92.9 fL (ref 80.0–100.0)
Monocytes Absolute: 0.7 10*3/uL (ref 0.1–1.0)
Monocytes Relative: 12 %
Neutro Abs: 3.4 10*3/uL (ref 1.7–7.7)
Neutrophils Relative %: 60 %
Platelet Count: 317 10*3/uL (ref 150–400)
RBC: 4.96 MIL/uL (ref 3.87–5.11)
RDW: 12 % (ref 11.5–15.5)
WBC Count: 5.7 10*3/uL (ref 4.0–10.5)
nRBC: 0 % (ref 0.0–0.2)

## 2022-10-02 NOTE — Progress Notes (Signed)
Hematology and Oncology Follow Up Visit  Hannah Roberts [id number] 1976/03/05 46 y.o. 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  She has negative JAK2 mutation and otherwise negative MPN workup.  She had elevation of her hemoglobin as far back as 2009.    Current therapy: Active surveillance.  Interim History: Hannah Roberts returns today for follow-up visit.  Since last visit, she reports no major changes in her health.  She denies any nausea, vomiting or abdominal pain.  She denies any dizziness or unsteadiness.  She denies any falls or syncope.  She denies any thrombosis or bleeding episodes.     Medications: I have reviewed the patient's current medications.  Current Outpatient Medications  Medication Sig Dispense Refill   amLODipine (NORVASC) 5 MG tablet 1 tablet     buPROPion (WELLBUTRIN XL) 150 MG 24 hr tablet Take 450 mg by mouth daily.     citalopram (CELEXA) 40 MG tablet Take 40 mg by mouth daily.  0   DEXILANT 60 MG capsule Take 1 capsule by mouth daily.  1   hydrochlorothiazide (HYDRODIURIL) 25 MG tablet Take 25 mg by mouth daily.  1   zolpidem (AMBIEN CR) 12.5 MG CR tablet Take 12.5 mg by mouth at bedtime as needed.     No current facility-administered medications for this visit.     Allergies:  Allergies  Allergen Reactions   Keflex [Cephalexin] Other (See Comments)    Sweating and rash   Penicillins       Physical Exam: Blood pressure (!) 147/103, pulse 81, temperature 97.8 F (36.6 C), temperature source Temporal, resp. rate 16, height 5' 3.5" (1.613 m), weight 162 lb 11.2 oz (73.8 kg), SpO2 97 %.  ECOG: 1    General appearance: Comfortable appearing without any discomfort Head: Normocephalic without any trauma Oropharynx: Mucous membranes are moist and pink without any thrush or ulcers. Eyes: Pupils are equal and round reactive to light. Lymph  nodes: No cervical, supraclavicular, inguinal or axillary lymphadenopathy.   Heart:regular rate and rhythm.  S1 and S2 without leg edema. Lung: Clear without any rhonchi or wheezes.  No dullness to percussion. Abdomin: Soft, nontender, nondistended with good bowel sounds.  No hepatosplenomegaly. Musculoskeletal: No joint deformity or effusion.  Full range of motion noted. Neurological: No deficits noted on motor, sensory and deep tendon reflex exam. Skin: No petechial rash or dryness.  Appeared moist.      Lab Results: Lab Results  Component Value Date   WBC 5.3 04/10/2022   HGB 16.8 (H) 04/10/2022   HCT 47.6 (H) 04/10/2022   MCV 89.5 04/10/2022   PLT 332 04/10/2022     Chemistry      Component Value Date/Time   NA 135 03/07/2022 1223   K 3.6 03/07/2022 1223   CL 96 (L) 03/07/2022 1223   CO2 29 03/07/2022 1223   BUN 12 03/07/2022 1223   CREATININE 0.82 03/07/2022 1223      Component Value Date/Time   CALCIUM 9.8 03/07/2022 1223         Impression and Plan:   46 year old woman with:  1.  Secondary polycythemia noted in 2023 and also as far back as 2009.    The natural course of these findings and differential diagnosis was discussed at this time.  Her workup in June 2023 did not show any genetic mutation to suggest myeloproliferative neoplasm.  Secondary causes remain the most likely etiology  although she does not have any clear-cut findings at this time.  She is on hydrochlorothiazide but no clinical signs and symptoms of sleep apnea.  Laboratory data from today showed a hemoglobin of 16 which is slightly above normal range and I have recommended surveillance at this time.  Repeating hematology workup with a bone marrow biopsy could be considered if other hematological findings arise in the future or hemoglobin that increases from her baseline.   2.  Thrombosis prophylaxis: He is at low risk of thrombosis at this time.  She does not require any prophylaxis.   3.   Follow-up: As needed in the future.  She prefers to have her blood test at with her primary care provider.  Will happy to reevaluate her in the future if other hematological considerations are noted.    30 minutes were spent on this encounter.  The time was dedicated to reviewing laboratory data, discussing differential diagnosis and management choices in the future.       Zola Button, MD 12/5/20239:28 AM

## 2023-06-12 IMAGING — DX DG CHEST 2V
2 series · 2 of 2 positions shown · non-contrast
Comparison: None Available.

CLINICAL DATA: Chest pain and hypertension.

EXAM:
CHEST - 2 VIEW

[chest pa]
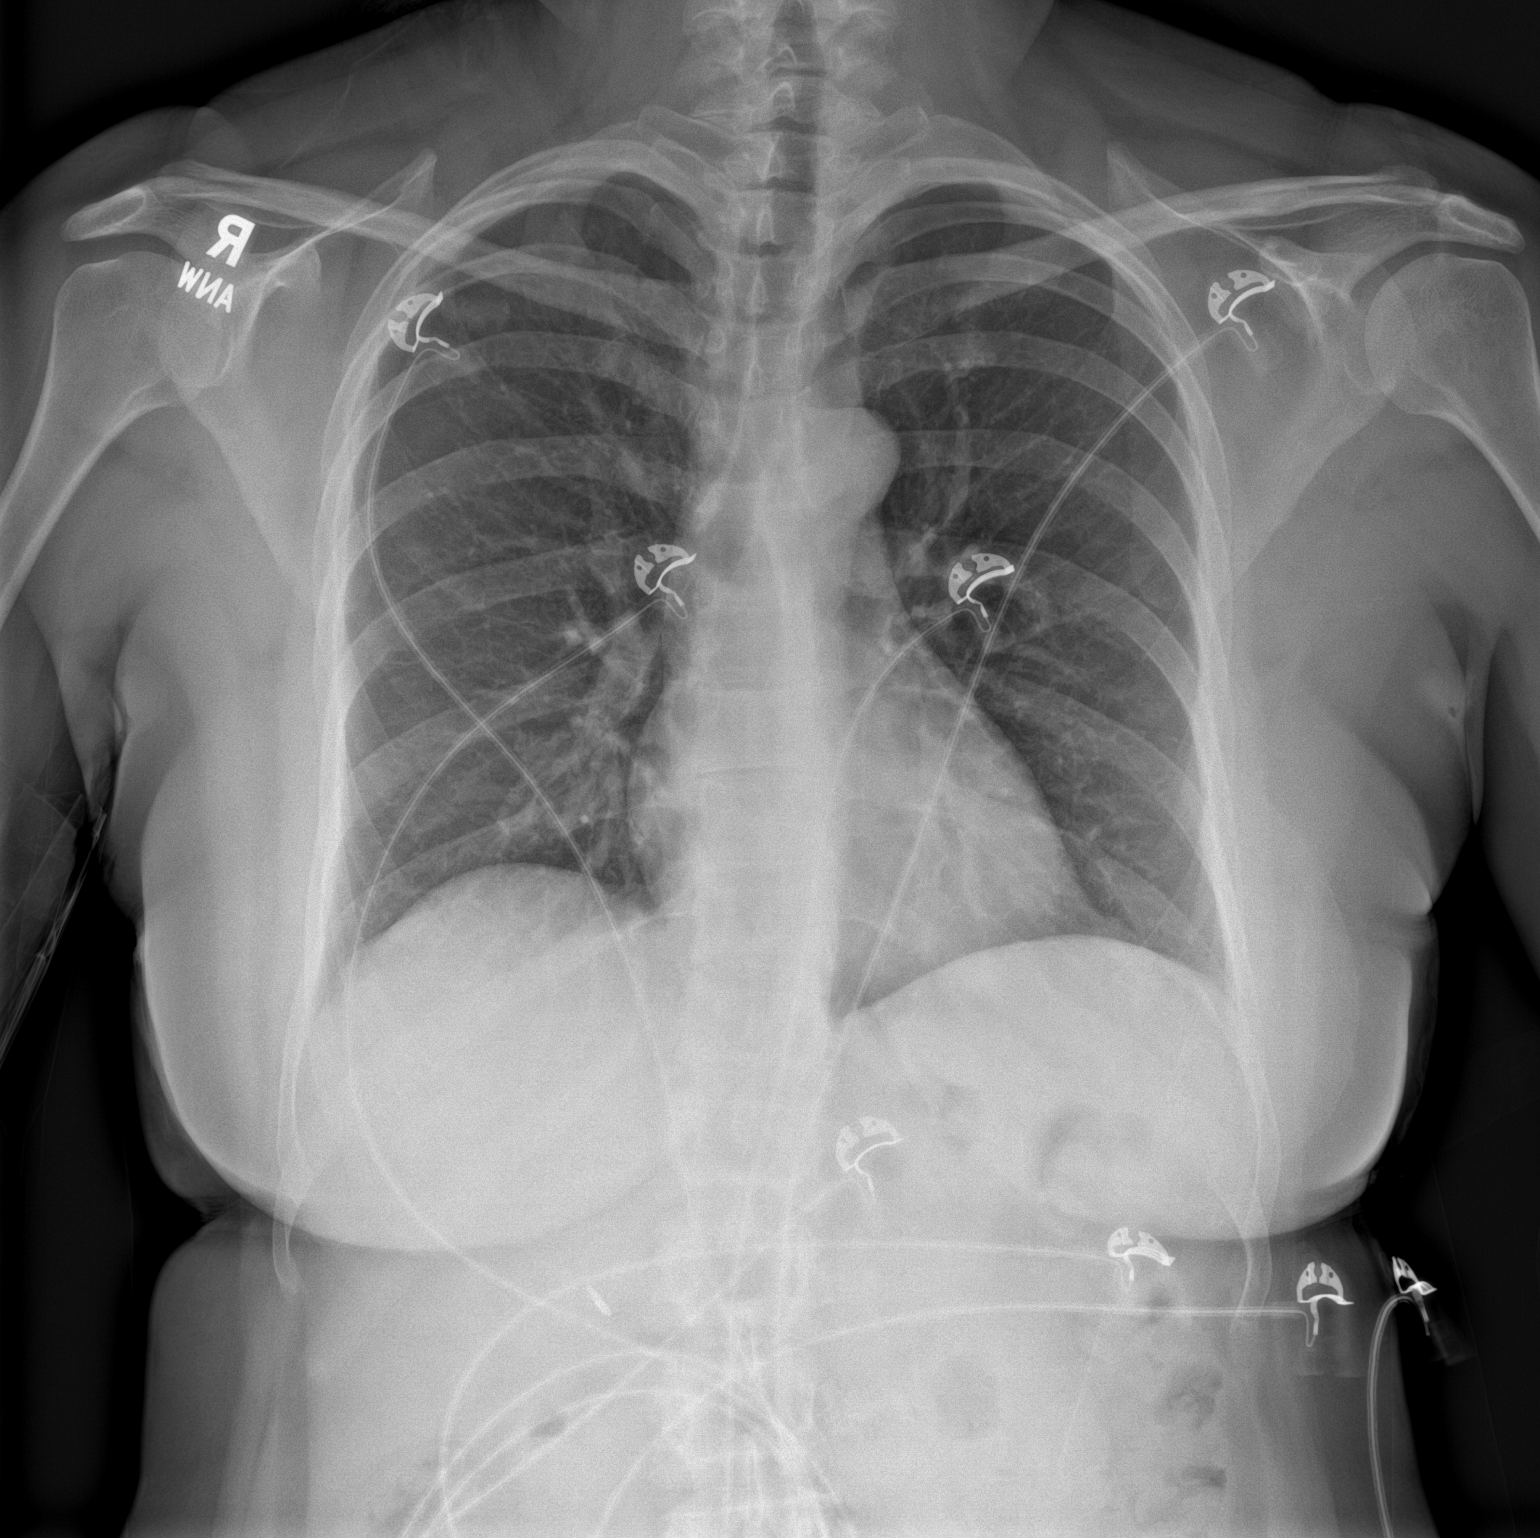

[chest lat]
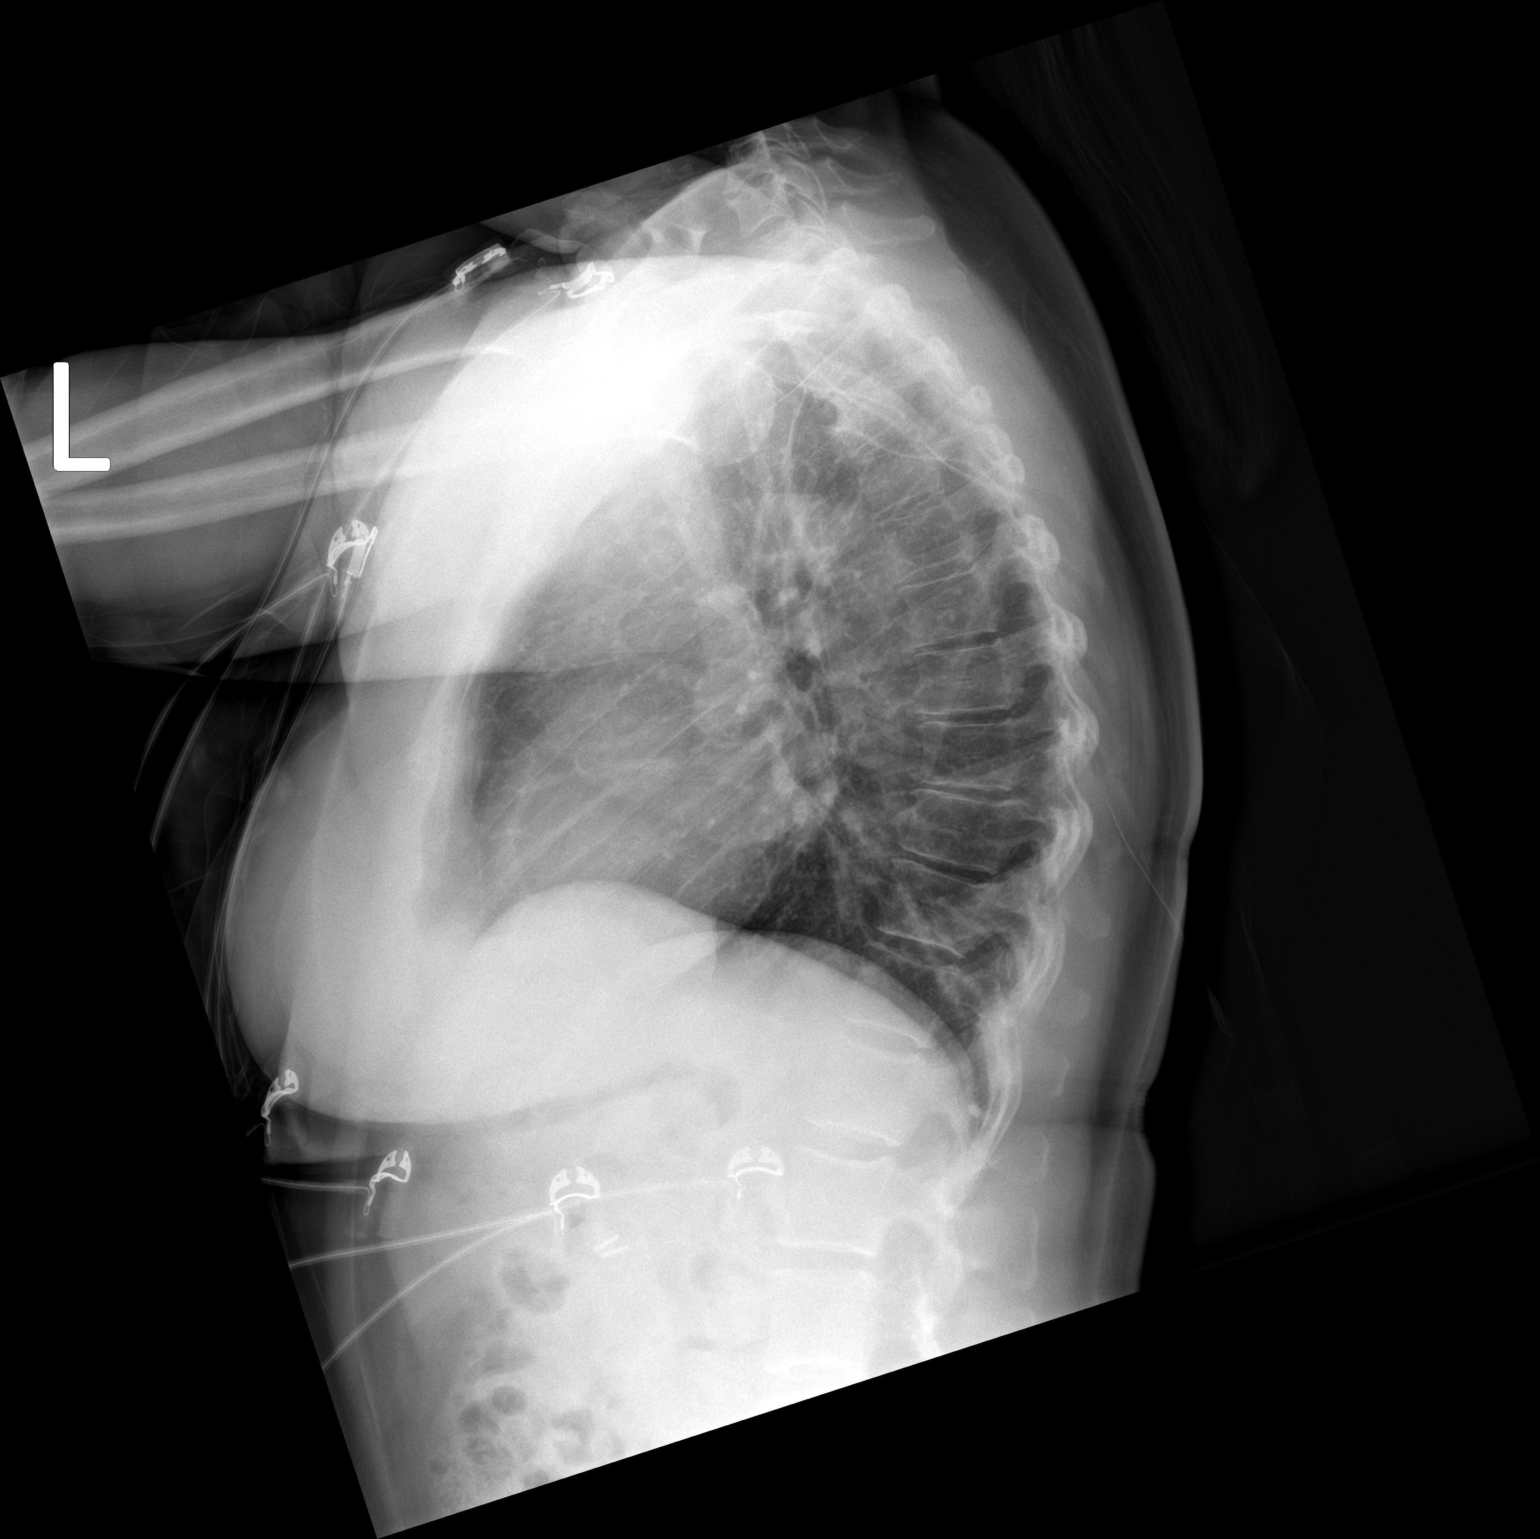

[2 of 2 positions shown; findings below may reference images not displayed]

FINDINGS: The cardiac silhouette, mediastinal and hilar contours are normal.
The lungs are clear. No pleural effusions. No pulmonary lesions. The
bony thorax is intact.
IMPRESSION: No acute cardiopulmonary findings.

## 2024-01-01 ENCOUNTER — Other Ambulatory Visit (HOSPITAL_BASED_OUTPATIENT_CLINIC_OR_DEPARTMENT_OTHER): Payer: Self-pay | Admitting: Family Medicine

## 2024-01-01 DIAGNOSIS — I7090 Unspecified atherosclerosis: Secondary | ICD-10-CM

## 2024-01-07 ENCOUNTER — Other Ambulatory Visit (HOSPITAL_COMMUNITY)

## 2024-01-15 ENCOUNTER — Ambulatory Visit (HOSPITAL_COMMUNITY)
Admission: RE | Admit: 2024-01-15 | Discharge: 2024-01-15 | Disposition: A | Discharge status: Home / Self Care | Payer: Self-pay | Source: Ambulatory Visit | Attending: Family Medicine | Admitting: Family Medicine

## 2024-01-15 DIAGNOSIS — I7090 Unspecified atherosclerosis: Secondary | ICD-10-CM | POA: Insufficient documentation

## 2024-03-26 ENCOUNTER — Other Ambulatory Visit: Payer: Self-pay | Admitting: Student

## 2024-03-26 DIAGNOSIS — R109 Unspecified abdominal pain: Secondary | ICD-10-CM

## 2024-03-27 ENCOUNTER — Ambulatory Visit
Admission: RE | Admit: 2024-03-27 | Discharge: 2024-03-27 | Disposition: A | Discharge status: Home / Self Care | Source: Ambulatory Visit | Attending: Student | Admitting: Student

## 2024-03-27 DIAGNOSIS — R109 Unspecified abdominal pain: Secondary | ICD-10-CM
